# Patient Record
Sex: Female | Born: 1975 | Hispanic: No | Marital: Married | Smoking: Never smoker
Health system: Southern US, Community
[De-identification: ages and names within clinical notes are randomized; demographics above are authoritative.]

## PROBLEM LIST (undated history)

## (undated) HISTORY — PX: KNEE SURGERY: SHX244

---

## 1998-10-10 ENCOUNTER — Other Ambulatory Visit: Admission: RE | Admit: 1998-10-10 | Discharge: 1998-10-10 | Payer: Self-pay | Admitting: *Deleted

## 1999-11-27 ENCOUNTER — Other Ambulatory Visit: Admission: RE | Admit: 1999-11-27 | Discharge: 1999-11-27 | Payer: Self-pay | Admitting: *Deleted

## 2000-12-16 ENCOUNTER — Other Ambulatory Visit: Admission: RE | Admit: 2000-12-16 | Discharge: 2000-12-16 | Payer: Self-pay | Admitting: *Deleted

## 2014-03-10 ENCOUNTER — Emergency Department (INDEPENDENT_AMBULATORY_CARE_PROVIDER_SITE_OTHER)
Admission: EM | Admit: 2014-03-10 | Discharge: 2014-03-10 | Disposition: A | Discharge status: Home / Self Care | Payer: 59 | Source: Home / Self Care | Attending: Emergency Medicine | Admitting: Emergency Medicine

## 2014-03-10 ENCOUNTER — Ambulatory Visit (HOSPITAL_BASED_OUTPATIENT_CLINIC_OR_DEPARTMENT_OTHER)
Admission: AD | Admit: 2014-03-10 | Discharge: 2014-03-10 | Disposition: A | Discharge status: Home / Self Care | Payer: 59 | Attending: Emergency Medicine | Admitting: Emergency Medicine

## 2014-03-10 ENCOUNTER — Encounter: Payer: Self-pay | Admitting: Emergency Medicine

## 2014-03-10 DIAGNOSIS — M25561 Pain in right knee: Secondary | ICD-10-CM | POA: Insufficient documentation

## 2014-03-10 DIAGNOSIS — M7989 Other specified soft tissue disorders: Secondary | ICD-10-CM | POA: Insufficient documentation

## 2014-03-10 DIAGNOSIS — R52 Pain, unspecified: Secondary | ICD-10-CM

## 2014-03-10 DIAGNOSIS — M79661 Pain in right lower leg: Secondary | ICD-10-CM

## 2014-03-10 NOTE — ED Notes (Signed)
Reports sitting cross legged 1-2 hours ago; when straightened legs had sudden pain behind right knee. Did start using tread climber 2 days ago. Breast feeding.

## 2014-03-10 NOTE — ED Provider Notes (Signed)
CSN: [id number]     Arrival date & time 03/10/14  1621 History   First MD Initiated Contact with Patient 03/10/14 1658     Chief Complaint  Patient presents with  . Leg Pain   (Consider location/radiation/quality/duration/timing/severity/associated sxs/prior Treatment) Patient is a 38 y.o. female presenting with leg pain. The history is provided by the patient. No language interpreter was used.  Leg Pain Location:  Knee Time since incident:  2 hours Knee location:  R knee Pain details:    Quality:  Aching   Radiates to:  Does not radiate   Severity:  Moderate   Onset quality:  Gradual   Duration:  2 hours   Timing:  Constant   Progression:  Unchanged Chronicity:  New Dislocation: no   Prior injury to area:  No Worsened by:  Nothing tried Ineffective treatments:  None tried Associated symptoms: no decreased ROM   Risk factors: no known bone disorder   Pt complains of pain in the back of her right knee.  Pt worried about a blood clot  History reviewed. No pertinent past medical history. Past Surgical History  Procedure Laterality Date  . Knee surgery Right     1999   Family History  Problem Relation Age of Onset  . Hypertension Mother   . Cancer Father   . Hyperlipidemia Father   . Hypertension Father    History  Substance Use Topics  . Smoking status: Never Smoker   . Smokeless tobacco: Not on file  . Alcohol Use: No   OB History    No data available     Review of Systems  Musculoskeletal: Positive for myalgias and gait problem. Negative for joint swelling.  All other systems reviewed and are negative.   Allergies  Codeine; Penicillins; and Sulfa antibiotics  Home Medications   Prior to Admission medications   Medication Sig Start Date End Date Taking? Authorizing Provider  ferrous sulfate 325 (65 FE) MG tablet Take 325 mg by mouth daily with breakfast.   Yes Historical Provider, MD  Multiple Vitamin (MULTIVITAMIN) tablet Take by mouth daily.   Yes  Historical Provider, MD   BP 103/68 mmHg  Pulse 70  Temp(Src) 98.5 F (36.9 C) (Oral)  Resp 16  Ht 5\' 2"  (1.575 m)  Wt 203 lb (92.08 kg)  BMI 37.12 kg/m2  SpO2 100% Physical Exam  Constitutional: She appears well-developed and well-nourished.  HENT:  Head: Normocephalic and atraumatic.  Cardiovascular: Normal rate.   Pulmonary/Chest: Effort normal.  Musculoskeletal: She exhibits tenderness.  Tender popliteal area behind right  Knee from  nv and ns from  Neurological: She is alert.  Skin: Skin is warm.  Psychiatric: She has a normal mood and affect.  Nursing note and vitals reviewed.   ED Course  Procedures (including critical care time) Labs Review Labs Reviewed - No data to display  Imaging Review No results found.   MDM  Ultrasound negative except small effusion   1. Knee pain, acute, right   2. Pain    Pt advised tylenol, rest, ice,   Recheck in 1 week if pain persist    Elson AreasLeslie K Sofia, PA-C 03/10/14 1732  Lonia SkinnerLeslie K MorrillSofia, [place] JerseyPA-C 03/10/14 1904

## 2014-03-10 NOTE — Discharge Instructions (Signed)

## 2014-03-16 ENCOUNTER — Telehealth: Payer: Self-pay | Admitting: Emergency Medicine

## 2015-11-19 IMAGING — US US EXTREM LOW VENOUS*R*
1 series · 13 of 24 positions shown · non-contrast
Comparison: None.

CLINICAL DATA: Pain posterior to the right knee, acute onset.
Personal history of previous knee surgery in 6111.



[Series 1: us extrem low venous*right* · 0.08mm/px · 29 acquisitions, 13 frames shown]
[im 1/29]
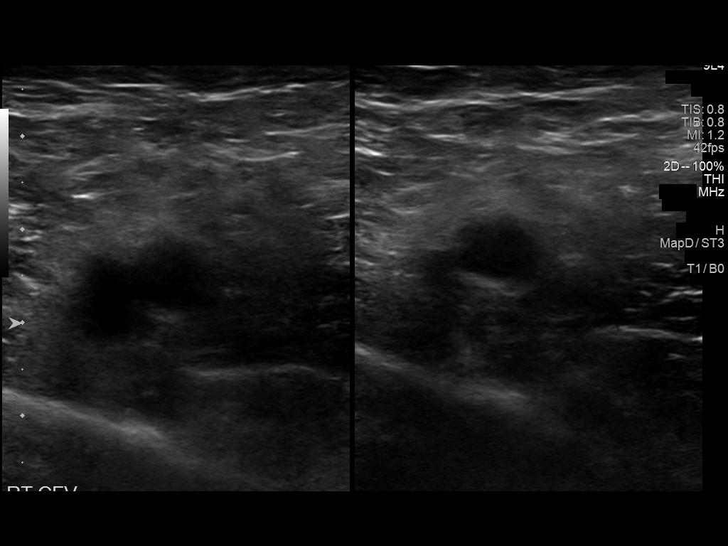
[im 3/29]
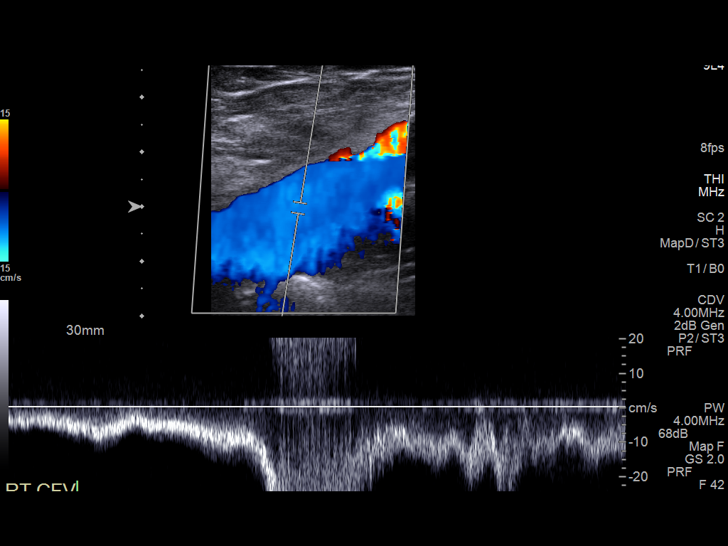
[im 5/29]
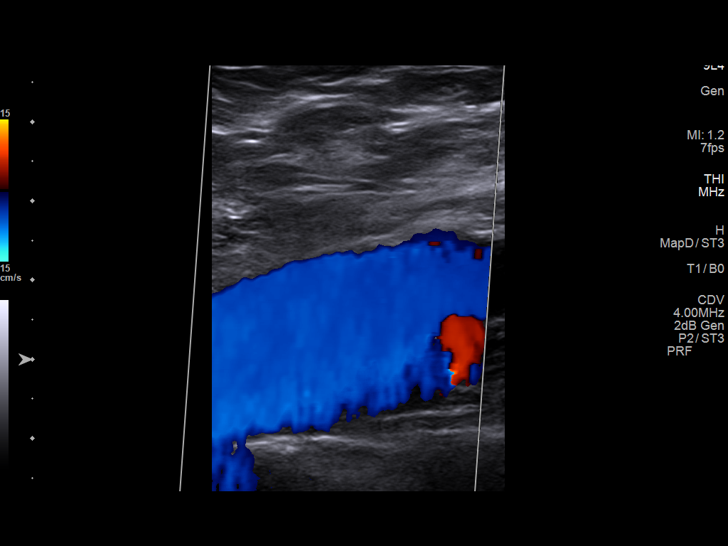
[im 8/29]
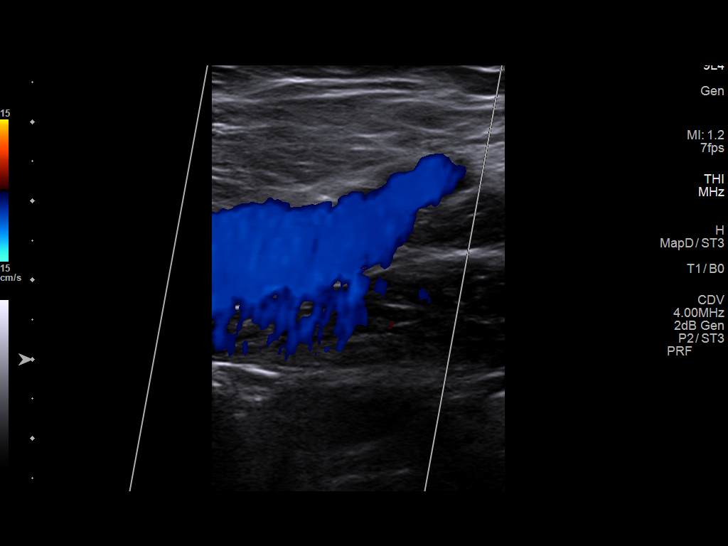
[im 10/29]
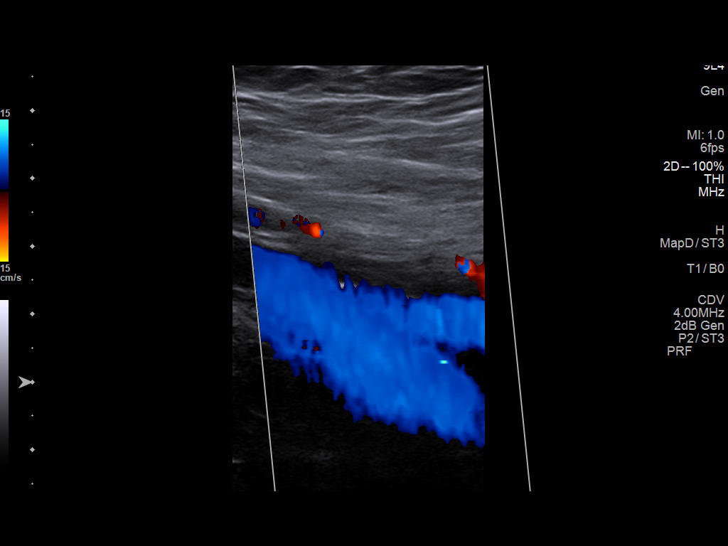
[im 13/29]
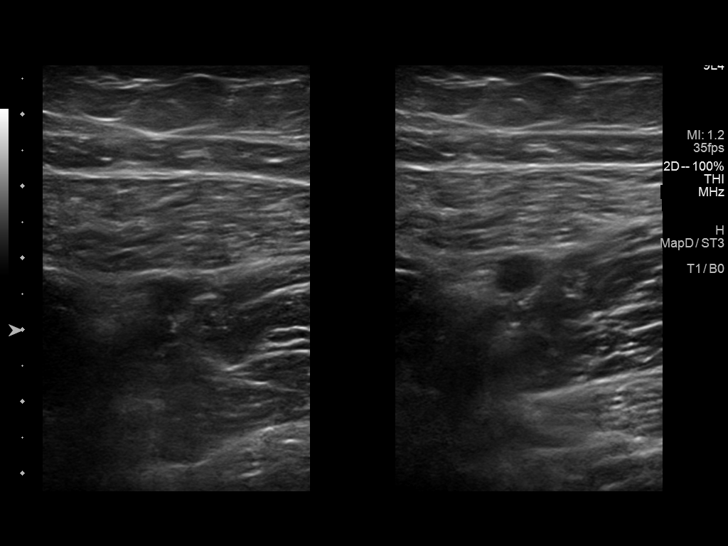
[im 16/29]
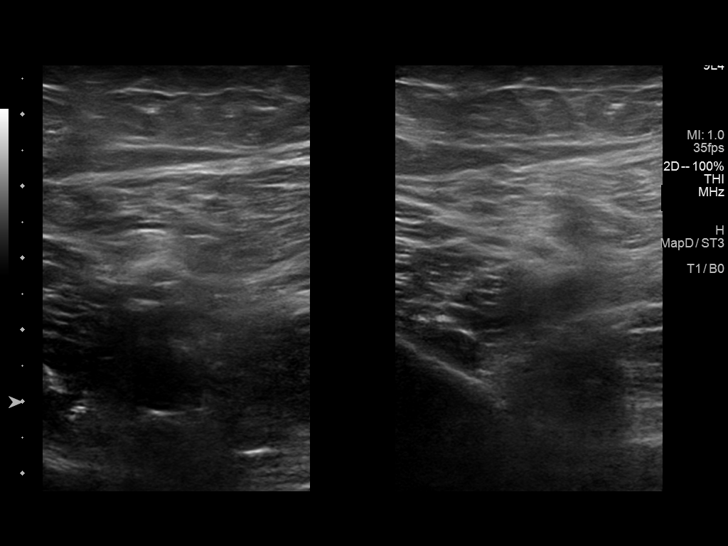
[im 18/29]
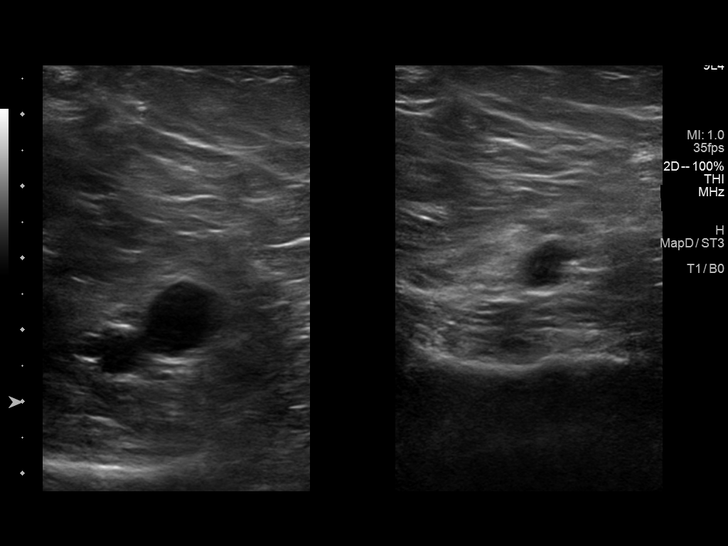
[im 20/29]
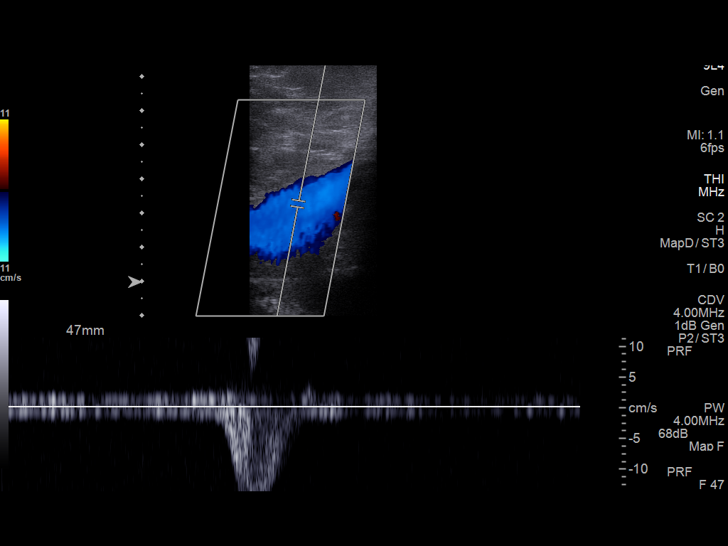
[im 22/29]
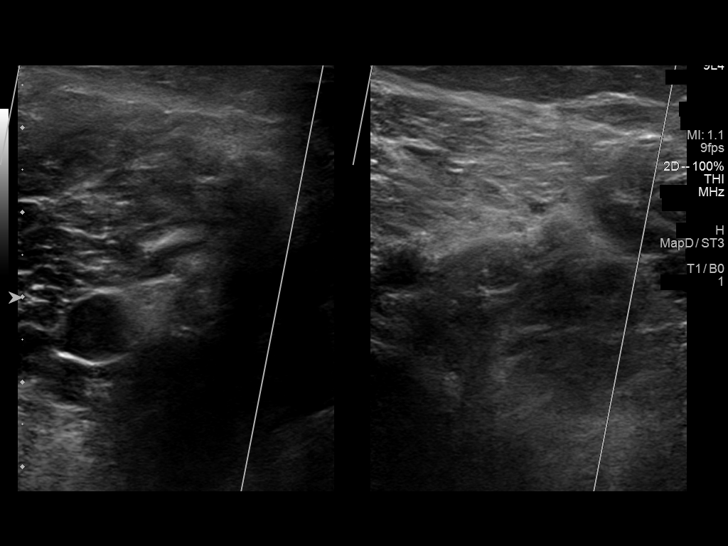
[im 25/29]
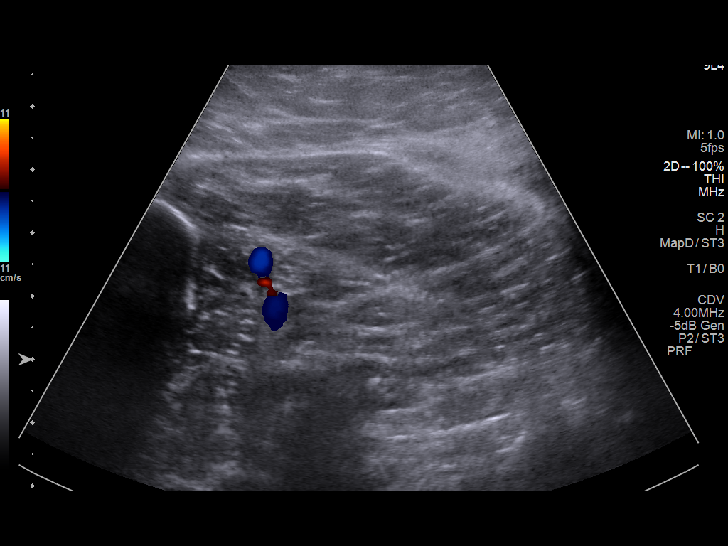
[im 26/29]
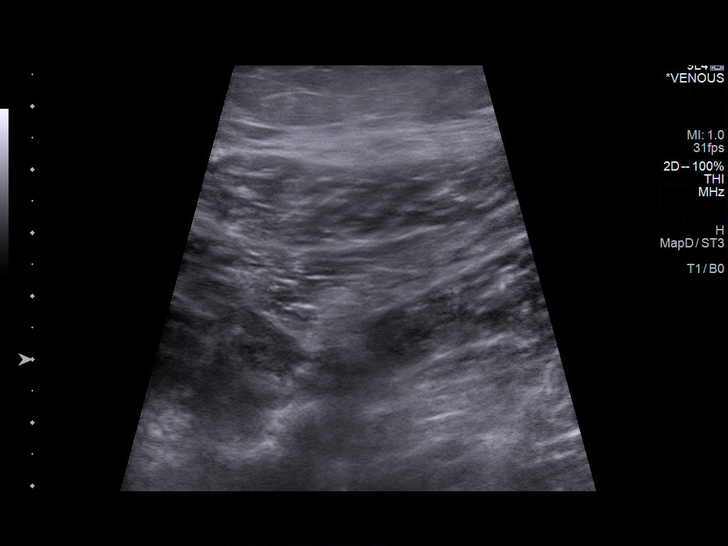
[im 29/29]
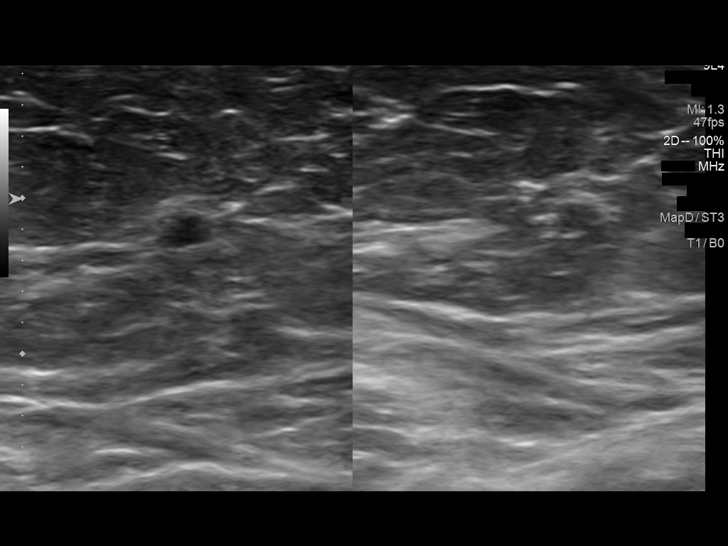

[13 of 24 positions shown; findings below may reference images not displayed]

FINDINGS: Contralateral Common Femoral Vein: Respiratory phasicity is normal
and symmetric with the symptomatic side. No evidence of thrombus.
Normal compressibility.

Common Femoral Vein: No evidence of thrombus. Normal
compressibility, respiratory phasicity and response to augmentation.

Saphenofemoral Junction: No evidence of thrombus. Normal
compressibility and flow on color Doppler imaging.

Profunda Femoral Vein: No evidence of thrombus. Normal
compressibility and flow on color Doppler imaging.

Femoral Vein: No evidence of thrombus. Normal compressibility,
respiratory phasicity and response to augmentation.

Popliteal Vein: No evidence of thrombus. Normal compressibility,
respiratory phasicity and response to augmentation.

Calf Veins: Posterior tibial vein is normal. Peroneal vein is not
visualized.

Superficial Great Saphenous Vein: No evidence of thrombus. Normal
compressibility and flow on color Doppler imaging.

Venous Reflux:  None.

Other Findings: There is a small amount of effusion in the knee
joint.
IMPRESSION: No evidence of deep venous thrombosis.
